# Patient Record
Sex: Female | Born: 2000 | Race: White | Hispanic: No | Marital: Single | Smoking: Never smoker
Health system: Southern US, Community
[De-identification: ages and names within clinical notes are randomized; demographics above are authoritative.]

## PROBLEM LIST (undated history)

## (undated) DIAGNOSIS — I10 Essential (primary) hypertension: Secondary | ICD-10-CM

---
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
